# Patient Record
Sex: Female | Born: 1970 | Race: White | Hispanic: No | Marital: Married | Smoking: Former smoker
Health system: Southern US, Community
[De-identification: ages and names within clinical notes are randomized; demographics above are authoritative.]

## PROBLEM LIST (undated history)

## (undated) DIAGNOSIS — K219 Gastro-esophageal reflux disease without esophagitis: Secondary | ICD-10-CM

## (undated) DIAGNOSIS — E063 Autoimmune thyroiditis: Secondary | ICD-10-CM

## (undated) DIAGNOSIS — G43909 Migraine, unspecified, not intractable, without status migrainosus: Secondary | ICD-10-CM

## (undated) HISTORY — PX: OTHER SURGICAL HISTORY: SHX169

## (undated) HISTORY — DX: Autoimmune thyroiditis: E06.3

## (undated) HISTORY — DX: Gastro-esophageal reflux disease without esophagitis: K21.9

---

## 2006-09-22 ENCOUNTER — Encounter: Admission: RE | Admit: 2006-09-22 | Discharge: 2006-09-22 | Payer: Self-pay | Admitting: Endocrinology

## 2007-04-07 HISTORY — PX: OTHER SURGICAL HISTORY: SHX169

## 2007-10-18 ENCOUNTER — Encounter: Admission: RE | Admit: 2007-10-18 | Discharge: 2007-10-18 | Payer: Self-pay | Admitting: Endocrinology

## 2007-11-03 ENCOUNTER — Encounter: Admission: RE | Admit: 2007-11-03 | Discharge: 2007-11-03 | Payer: Self-pay | Admitting: Endocrinology

## 2007-12-23 ENCOUNTER — Encounter (INDEPENDENT_AMBULATORY_CARE_PROVIDER_SITE_OTHER): Payer: Self-pay | Admitting: General Surgery

## 2007-12-23 ENCOUNTER — Ambulatory Visit (HOSPITAL_BASED_OUTPATIENT_CLINIC_OR_DEPARTMENT_OTHER): Admission: RE | Admit: 2007-12-23 | Discharge: 2007-12-23 | Payer: Self-pay | Admitting: General Surgery

## 2008-01-06 ENCOUNTER — Encounter: Admission: RE | Admit: 2008-01-06 | Discharge: 2008-01-06 | Payer: Self-pay | Admitting: General Surgery

## 2008-02-02 ENCOUNTER — Ambulatory Visit: Payer: Self-pay | Admitting: Oncology

## 2008-02-09 ENCOUNTER — Ambulatory Visit (HOSPITAL_COMMUNITY): Admission: RE | Admit: 2008-02-09 | Discharge: 2008-02-09 | Payer: Self-pay | Admitting: General Surgery

## 2008-02-09 ENCOUNTER — Encounter (INDEPENDENT_AMBULATORY_CARE_PROVIDER_SITE_OTHER): Payer: Self-pay | Admitting: Interventional Radiology

## 2010-04-27 ENCOUNTER — Encounter: Payer: Self-pay | Admitting: Endocrinology

## 2010-08-19 NOTE — Op Note (Signed)
NAMEJALEAH, LEFEVRE               ACCOUNT NO.:  [id number]   MEDICAL RECORD NO.:  [id number]          PATIENT TYPE:  AMB   LOCATION:  DSC                          FACILITY:  [facility]   PHYSICIAN:  Juanetta Gosling, MDDATE OF BIRTH:  29-Apr-1970   DATE OF PROCEDURE:  12/23/2007  DATE OF DISCHARGE:                               OPERATIVE REPORT   PREOPERATIVE DIAGNOSIS:  Left thigh melanoma.   POSTOPERATIVE DIAGNOSIS:  Left thigh melanoma.   PROCEDURE:  Left thigh melanoma wide local excision and left inguinal  sentinel node biopsy.   SURGEON:  Troy Sine. Dwain Sarna, MD   ASSISTANT:  Gabrielle Dare. Janee Morn, MD   ANESTHESIA:  General.   FINDINGS:  Sentinel node in the left groin with a final count of 1045  with a background of 0.   SPECIMENS:  Sentinel node and wide local excision melanoma to Pathology.   ESTIMATED BLOOD LOSS:  Minimal.   COMPLICATIONS:  None.   DRAINS:  None.   DISPOSITION:  To PACU in a stable condition.   HISTORY:  Ms. Kjos is a 40 year old female with a history of multiple  abnormal nevi, gets routine dermatologic followup with a history of  multiple mole removals.  She recently presented to Dr. Donzetta Starch with  complaints of couple of new moles, one on her left leg that was raised.  She noticed it while she was shaving.  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  After discussion with Mrs. Gaultney, we  planned to perform a left thigh melanoma wide local excision with 1-cm  margin as well as a left inguinal sentinel node biopsy.   PROCEDURE:  After informed consent was obtained, the patient was first  taken to Nuclear Medicine where she was injected with radioactive  tracer.  She was then brought to the operating room.  A 1 g of  intravenous Ancef was administered.  She then underwent general  endotracheal anesthesia without complication prior to prepping.  I then  used the NeoProbe to identify the site in her groin that was hot.  This  was very easy to identify, was several centimeters below Poupart's  ligament.  The left groin and thigh were then prepped and draped in a  standard sterile surgical fashion.  The area again was marked after  identification of the NeoProbe.  Approximately, a 2-cm incision was made  overlying the hot spot identified by the NeoProbe.  Dissection was  carried out down to the superficial nodes where there was several bundle  of lymph nodes noted, all of which were hot.  Prior to beginning this, a  total of 1 mL of blue dye had been infiltrated intradermally into the  skin around the melanoma.  The nodes in concern were also noted to be  blue as well.  These nodes were then removed and total count was taken  by the NeoProbe when this was done was approximately 1045.  The  background of the NeoProbe when placed into the wound was 0.  This wound  was irrigated.  Hemostasis was observed.  This was then closed with a  deep layer with 3-0 Vicryl and Monocryl in a subcuticular fashion for  the skin.  Attention was then turned towards her melanoma.  The melanoma  was marked, and a 1-cm margin in each direction was identified.  This  was approximately 3.5-cm wide from the area she had her biopsy.  I then  made an incision approximately 8 cm long to ensure that this would be  able to be closed correctly.  This included 1-cm margin in each area  around the melanoma.  Dissection was carried out down through the skin  and subcutaneous tissue, and this area was then removed in total.  This  was marked with a single stitch in the superior portion.  Upon  completion of this, hemostasis was observed.  A deep layer of 2-0 Vicryl  was then used to close the superficial tissue and then 2-0 nylon sutures  were used in a vertical mattress fashion to close the skin.  This  closed  well.  She tolerated this procedure well.  Local anesthetic was  infiltrated in both wounds.  Dressing was placed on the inferior most  wound, and Dermabond was placed on the groin wound.  She was awakened  and extubated in the operating room and transferred to the recovery in a  stable condition.      Juanetta Gosling, MD  Electronically Signed     MCW/MEDQ  D:  12/23/2007  T:  12/24/2007  Job:  [id number]   cc:   Rocco Serene, M.D.  Selinda Flavin

## 2010-08-27 ENCOUNTER — Ambulatory Visit (HOSPITAL_COMMUNITY)
Admission: RE | Admit: 2010-08-27 | Discharge: 2010-08-27 | Disposition: A | Discharge status: Home / Self Care | Payer: Commercial Managed Care - PPO | Source: Ambulatory Visit | Attending: Family Medicine | Admitting: Family Medicine

## 2010-08-27 DIAGNOSIS — I890 Lymphedema, not elsewhere classified: Secondary | ICD-10-CM | POA: Insufficient documentation

## 2010-08-27 DIAGNOSIS — M6281 Muscle weakness (generalized): Secondary | ICD-10-CM | POA: Insufficient documentation

## 2010-08-27 DIAGNOSIS — R262 Difficulty in walking, not elsewhere classified: Secondary | ICD-10-CM | POA: Insufficient documentation

## 2010-08-27 DIAGNOSIS — IMO0001 Reserved for inherently not codable concepts without codable children: Secondary | ICD-10-CM | POA: Insufficient documentation

## 2011-01-05 LAB — COMPREHENSIVE METABOLIC PANEL
ALT: 15
Alkaline Phosphatase: 46
BUN: 10
CO2: 27
GFR calc non Af Amer: >60
Total Bilirubin: 0.7
Total Protein: 6.6

## 2011-01-05 LAB — PREGNANCY, URINE: Preg Test, Ur: NEGATIVE

## 2011-01-05 LAB — DIFFERENTIAL
Basophils Absolute: 0
Lymphocytes Relative: 31
Lymphs Abs: 1.7
Monocytes Relative: 7

## 2011-01-05 LAB — CBC: RDW: 13.2

## 2011-01-06 LAB — BODY FLUID CULTURE: Culture: NO GROWTH

## 2011-09-30 ENCOUNTER — Other Ambulatory Visit: Payer: Self-pay

## 2012-04-01 ENCOUNTER — Other Ambulatory Visit: Payer: Self-pay

## 2012-08-25 ENCOUNTER — Other Ambulatory Visit: Payer: Self-pay

## 2012-10-31 ENCOUNTER — Other Ambulatory Visit: Payer: Self-pay

## 2013-05-26 ENCOUNTER — Other Ambulatory Visit: Payer: Self-pay

## 2013-07-06 ENCOUNTER — Other Ambulatory Visit: Payer: Self-pay

## 2013-07-06 DIAGNOSIS — Z1231 Encounter for screening mammogram for malignant neoplasm of breast: Secondary | ICD-10-CM

## 2013-07-31 ENCOUNTER — Ambulatory Visit
Admission: RE | Admit: 2013-07-31 | Discharge: 2013-07-31 | Disposition: A | Discharge status: Home / Self Care | Payer: 59 | Source: Ambulatory Visit

## 2013-07-31 ENCOUNTER — Encounter (INDEPENDENT_AMBULATORY_CARE_PROVIDER_SITE_OTHER): Payer: Self-pay

## 2013-07-31 DIAGNOSIS — Z1231 Encounter for screening mammogram for malignant neoplasm of breast: Secondary | ICD-10-CM

## 2016-01-27 ENCOUNTER — Other Ambulatory Visit: Payer: Self-pay | Admitting: Unknown Physician Specialty

## 2016-01-27 DIAGNOSIS — Z1231 Encounter for screening mammogram for malignant neoplasm of breast: Secondary | ICD-10-CM

## 2016-02-19 ENCOUNTER — Ambulatory Visit: Payer: Self-pay

## 2016-03-18 ENCOUNTER — Ambulatory Visit: Payer: Self-pay

## 2016-04-17 DIAGNOSIS — M542 Cervicalgia: Secondary | ICD-10-CM | POA: Diagnosis not present

## 2016-04-27 DIAGNOSIS — M5412 Radiculopathy, cervical region: Secondary | ICD-10-CM | POA: Diagnosis not present

## 2016-07-01 ENCOUNTER — Ambulatory Visit
Admission: RE | Admit: 2016-07-01 | Discharge: 2016-07-01 | Disposition: A | Discharge status: Home / Self Care | Payer: 59 | Source: Ambulatory Visit | Attending: Unknown Physician Specialty | Admitting: Unknown Physician Specialty

## 2016-07-01 DIAGNOSIS — Z1231 Encounter for screening mammogram for malignant neoplasm of breast: Secondary | ICD-10-CM

## 2016-08-03 DIAGNOSIS — N390 Urinary tract infection, site not specified: Secondary | ICD-10-CM | POA: Diagnosis not present

## 2016-10-08 DIAGNOSIS — E039 Hypothyroidism, unspecified: Secondary | ICD-10-CM | POA: Diagnosis not present

## 2016-10-09 DIAGNOSIS — E039 Hypothyroidism, unspecified: Secondary | ICD-10-CM | POA: Diagnosis not present

## 2016-10-09 DIAGNOSIS — E042 Nontoxic multinodular goiter: Secondary | ICD-10-CM | POA: Diagnosis not present

## 2016-10-13 DIAGNOSIS — Z08 Encounter for follow-up examination after completed treatment for malignant neoplasm: Secondary | ICD-10-CM | POA: Diagnosis not present

## 2016-10-13 DIAGNOSIS — Z85820 Personal history of malignant melanoma of skin: Secondary | ICD-10-CM | POA: Diagnosis not present

## 2016-10-13 DIAGNOSIS — Z1283 Encounter for screening for malignant neoplasm of skin: Secondary | ICD-10-CM | POA: Diagnosis not present

## 2016-10-19 DIAGNOSIS — R5383 Other fatigue: Secondary | ICD-10-CM | POA: Diagnosis not present

## 2016-10-19 DIAGNOSIS — G4489 Other headache syndrome: Secondary | ICD-10-CM | POA: Diagnosis not present

## 2016-10-19 DIAGNOSIS — Z0000 Encounter for general adult medical examination without abnormal findings: Secondary | ICD-10-CM | POA: Diagnosis not present

## 2016-10-19 DIAGNOSIS — E039 Hypothyroidism, unspecified: Secondary | ICD-10-CM | POA: Diagnosis not present

## 2017-01-29 DIAGNOSIS — Z23 Encounter for immunization: Secondary | ICD-10-CM | POA: Diagnosis not present

## 2017-05-07 DIAGNOSIS — Z719 Counseling, unspecified: Secondary | ICD-10-CM | POA: Diagnosis not present

## 2017-11-08 ENCOUNTER — Other Ambulatory Visit: Payer: Self-pay | Admitting: Unknown Physician Specialty

## 2017-11-08 DIAGNOSIS — Z1231 Encounter for screening mammogram for malignant neoplasm of breast: Secondary | ICD-10-CM

## 2017-11-12 DIAGNOSIS — L0390 Cellulitis, unspecified: Secondary | ICD-10-CM | POA: Diagnosis not present

## 2017-12-01 DIAGNOSIS — E039 Hypothyroidism, unspecified: Secondary | ICD-10-CM | POA: Diagnosis not present

## 2017-12-01 DIAGNOSIS — Z6829 Body mass index (BMI) 29.0-29.9, adult: Secondary | ICD-10-CM | POA: Diagnosis not present

## 2017-12-01 DIAGNOSIS — Z0000 Encounter for general adult medical examination without abnormal findings: Secondary | ICD-10-CM | POA: Diagnosis not present

## 2017-12-10 ENCOUNTER — Ambulatory Visit: Payer: 59

## 2017-12-10 ENCOUNTER — Ambulatory Visit
Admission: RE | Admit: 2017-12-10 | Discharge: 2017-12-10 | Disposition: A | Discharge status: Home / Self Care | Payer: 59 | Source: Ambulatory Visit | Attending: Unknown Physician Specialty | Admitting: Unknown Physician Specialty

## 2017-12-10 DIAGNOSIS — Z1231 Encounter for screening mammogram for malignant neoplasm of breast: Secondary | ICD-10-CM

## 2017-12-13 DIAGNOSIS — E039 Hypothyroidism, unspecified: Secondary | ICD-10-CM | POA: Diagnosis not present

## 2017-12-15 DIAGNOSIS — E039 Hypothyroidism, unspecified: Secondary | ICD-10-CM | POA: Diagnosis not present

## 2017-12-15 DIAGNOSIS — E042 Nontoxic multinodular goiter: Secondary | ICD-10-CM | POA: Diagnosis not present

## 2018-01-31 DIAGNOSIS — Z23 Encounter for immunization: Secondary | ICD-10-CM | POA: Diagnosis not present

## 2018-03-01 DIAGNOSIS — E039 Hypothyroidism, unspecified: Secondary | ICD-10-CM | POA: Diagnosis not present

## 2018-04-04 DIAGNOSIS — D225 Melanocytic nevi of trunk: Secondary | ICD-10-CM | POA: Diagnosis not present

## 2018-04-04 DIAGNOSIS — Z1283 Encounter for screening for malignant neoplasm of skin: Secondary | ICD-10-CM | POA: Diagnosis not present

## 2018-04-04 DIAGNOSIS — Z85820 Personal history of malignant melanoma of skin: Secondary | ICD-10-CM | POA: Diagnosis not present

## 2018-05-04 DIAGNOSIS — K210 Gastro-esophageal reflux disease with esophagitis: Secondary | ICD-10-CM | POA: Diagnosis not present

## 2018-05-04 DIAGNOSIS — R05 Cough: Secondary | ICD-10-CM | POA: Diagnosis not present

## 2018-05-04 DIAGNOSIS — Z6830 Body mass index (BMI) 30.0-30.9, adult: Secondary | ICD-10-CM | POA: Diagnosis not present

## 2018-06-06 DIAGNOSIS — Z6830 Body mass index (BMI) 30.0-30.9, adult: Secondary | ICD-10-CM | POA: Diagnosis not present

## 2018-06-06 DIAGNOSIS — J0101 Acute recurrent maxillary sinusitis: Secondary | ICD-10-CM | POA: Diagnosis not present

## 2018-06-28 DIAGNOSIS — J019 Acute sinusitis, unspecified: Secondary | ICD-10-CM | POA: Diagnosis not present

## 2018-12-02 ENCOUNTER — Other Ambulatory Visit: Payer: Self-pay | Admitting: Unknown Physician Specialty

## 2018-12-02 DIAGNOSIS — Z1231 Encounter for screening mammogram for malignant neoplasm of breast: Secondary | ICD-10-CM

## 2019-01-17 ENCOUNTER — Ambulatory Visit: Payer: 59

## 2019-09-27 ENCOUNTER — Other Ambulatory Visit: Payer: Self-pay

## 2019-09-27 ENCOUNTER — Ambulatory Visit
Admission: RE | Admit: 2019-09-27 | Discharge: 2019-09-27 | Disposition: A | Discharge status: Home / Self Care | Payer: 59 | Source: Ambulatory Visit | Attending: Unknown Physician Specialty | Admitting: Unknown Physician Specialty

## 2019-09-27 DIAGNOSIS — Z1231 Encounter for screening mammogram for malignant neoplasm of breast: Secondary | ICD-10-CM

## 2019-12-06 HISTORY — PX: COLONOSCOPY: SHX174

## 2020-04-10 ENCOUNTER — Encounter: Payer: Self-pay | Admitting: Internal Medicine

## 2020-05-01 ENCOUNTER — Encounter: Payer: Self-pay | Admitting: *Deleted

## 2020-05-01 ENCOUNTER — Encounter: Payer: Self-pay | Admitting: Internal Medicine

## 2020-05-01 ENCOUNTER — Ambulatory Visit (INDEPENDENT_AMBULATORY_CARE_PROVIDER_SITE_OTHER): Payer: 59 | Admitting: Gastroenterology

## 2020-05-01 ENCOUNTER — Other Ambulatory Visit: Payer: Self-pay

## 2020-05-01 ENCOUNTER — Encounter: Payer: Self-pay | Admitting: Gastroenterology

## 2020-05-01 DIAGNOSIS — K219 Gastro-esophageal reflux disease without esophagitis: Secondary | ICD-10-CM | POA: Diagnosis not present

## 2020-05-01 DIAGNOSIS — K642 Third degree hemorrhoids: Secondary | ICD-10-CM

## 2020-05-01 NOTE — Patient Instructions (Addendum)
Continue omeprazole once daily, making sure 30 minutes before breakfast for best efficacy.  I would like for you to try Linzess. Start taking Linzess 1 capsule 30 minutes before breakfast daily. It is normal to have some looser stool starting out for the first few days, but this should improve. If it does not, please call us, as we will need to adjust the dosage.   We will arrange a hemorrhoid banding in the near future!  I have also ordered an xray of your esophagus to see what is going on with your swallowing.  I have attached a reflux handout sheet that may be helpful!  It was a pleasure to see you today. I want to create trusting relationships with patients to provide genuine, compassionate, and quality care. I value your feedback. If you receive a survey regarding your visit,  I greatly appreciate you taking time to fill this out.   Annitta Needs, PhD, ANP-BC [facility]    Food Choices for Gastroesophageal Reflux Disease, Adult When you have gastroesophageal reflux disease (GERD), the foods you eat and your eating habits are very important. Choosing the right foods can help ease the discomfort of GERD. Consider working with a dietitian to help you make healthy food choices. What are tips for following this plan? Reading food labels  Look for foods that are low in saturated fat. Foods that have less than 5% of daily value (DV) of fat and 0 g of trans fats may help with your symptoms. Cooking  Cook foods using methods other than frying. This may include baking, steaming, grilling, or broiling. These are all methods that do not need a lot of fat for cooking.  To add flavor, try to use herbs that are low in spice and acidity. Meal planning  Choose healthy foods that are low in fat, such as fruits, vegetables, whole grains, low-fat dairy products, lean meats, fish, and poultry.  Eat frequent, small meals instead of three large meals each day. Eat your meals slowly, in  a relaxed setting. Avoid bending over or lying down until 2-3 hours after eating.  Limit high-fat foods such as fatty meats or fried foods.  Limit your intake of fatty foods, such as oils, butter, and shortening.  Avoid the following as told by your health care provider: ? Foods that cause symptoms. These may be different for different people. Keep a food diary to keep track of foods that cause symptoms. ? Alcohol. ? Drinking large amounts of liquid with meals. ? Eating meals during the 2-3 hours before bed.   Lifestyle  Maintain a healthy weight. Ask your health care provider what weight is healthy for you. If you need to lose weight, work with your health care provider to do so safely.  Exercise for at least 30 minutes on 5 or more days each week, or as told by your health care provider.  Avoid wearing clothes that fit tightly around your waist and chest.  Do not use any products that contain nicotine or tobacco. These products include cigarettes, chewing tobacco, and vaping devices, such as e-cigarettes. If you need help quitting, ask your health care provider.  Sleep with the head of your bed raised. Use a wedge under the mattress or blocks under the bed frame to raise the head of the bed.  Chew sugar-free gum after mealtimes. What foods should I eat? Eat a healthy, well-balanced diet of fruits, vegetables, whole grains, low-fat dairy products, lean meats, fish, and poultry. Each  person is different. Foods that may trigger symptoms in one person may not trigger any symptoms in another person. Work with your health care provider to identify foods that are safe for you. The items listed above may not be a complete list of recommended foods and beverages. Contact a dietitian for more information.   What foods should I avoid? Limiting some of these foods may help manage the symptoms of GERD. Everyone is different. Consult a dietitian or your health care provider to help you identify the  exact foods to avoid, if any. Fruits Any fruits prepared with added fat. Any fruits that cause symptoms. For some people this may include citrus fruits, such as oranges, grapefruit, pineapple, and lemons. Vegetables Deep-fried vegetables. [place] fries. Any vegetables prepared with added fat. Any vegetables that cause symptoms. For some people, this may include tomatoes and tomato products, chili peppers, onions and garlic, and horseradish. Grains Pastries or quick breads with added fat. Meats and other proteins High-fat meats, such as fatty beef or pork, hot dogs, ribs, ham, sausage, salami, and bacon. Fried meat or protein, including fried fish and fried chicken. Nuts and nut butters, in large amounts. Dairy Whole milk and chocolate milk. Sour cream. Cream. Ice cream. Cream cheese. Milkshakes. Fats and oils Butter. Margarine. Shortening. Ghee. Beverages Coffee and tea, with or without caffeine. Carbonated beverages. Sodas. Energy drinks. Fruit juice made with acidic fruits, such as orange or grapefruit. Tomato juice. Alcoholic drinks. Sweets and desserts Chocolate and cocoa. Donuts. Seasonings and condiments Pepper. Peppermint and spearmint. Added salt. Any condiments, herbs, or seasonings that cause symptoms. For some people, this may include curry, hot sauce, or vinegar-based salad dressings. The items listed above may not be a complete list of foods and beverages to avoid. Contact a dietitian for more information. Questions to ask your health care provider Diet and lifestyle changes are usually the first steps that are taken to manage symptoms of GERD. If diet and lifestyle changes do not improve your symptoms, talk with your health care provider about taking medicines. Where to find more information  International Foundation for Gastrointestinal Disorders: aboutgerd.org Summary  When you have gastroesophageal reflux disease (GERD), food and lifestyle choices may be very helpful in  easing the discomfort of GERD.  Eat frequent, small meals instead of three large meals each day. Eat your meals slowly, in a relaxed setting. Avoid bending over or lying down until 2-3 hours after eating.  Limit high-fat foods such as fatty meats or fried foods. This information is not intended to replace advice given to you by your health care provider. Make sure you discuss any questions you have with your health care provider. Document Revised: 10/02/2019 Document Reviewed: 10/02/2019 Elsevier Patient Education  [place].

## 2020-05-01 NOTE — Progress Notes (Signed)
Primary Care Physician:  Rory Percy, MD  Referring Physician: Launa Grill, PA-C/Dr. Nadara Mustard Primary Gastroenterologist:  Dr. Abbey Chatters  Chief Complaint  Patient presents with  . Hemorrhoids    Saw surgeon at [place]. Had TCS done by them as well 12/2019. Had thrombosed hem removed in May    HPI:   Jackie Ward is a 50 y.o. female presenting today at the request of Launa Grill, PA-C/Dr. Nadara Mustard due to constipation and hemorrhoids. Colonoscopy completed at [facility] in Sept 2021 with minimal internal hemorrhoids, no polyps. Moderate external hemorrhoid skin tags.   No straining. 3 colace a day. Stool soft. Sometimes not as productive. BM twice a week since she was young.  On toilet 10-15 minutes at a time. Will have BM then wait, then will go again while sitting there. No abdominal pain. Fiber made stool harder. No rectal pain or discomfort. More flares of hemorrhoids later. Feels almost constant to have tissue out. Fecal soiling.   Episodes of coughing in past, which had improved with omeprazole. Now returned. Used to have nocturnal coughing. Now will be during the day. Coughing once or twice per day. Feels like food goes down the wrong way at times. No globus sensation.   Past Medical History:  Diagnosis Date  . GERD (gastroesophageal reflux disease)   . Hashimoto's disease     Past Surgical History:  Procedure Laterality Date  . COLONOSCOPY  12/2019   [facility]: minimal internal hemorrhoids, no polyps. Moderate external hemorrhoid skin tags.   . melanoma back excision    . melanoma thigh excision  2009    Current Outpatient Medications  Medication Sig Dispense Refill  . Cetirizine HCl (ZYRTEC ALLERGY) 10 MG CAPS Take by mouth as needed.    Mariane Baumgarten Sodium (DSS) 100 MG CAPS Take 3 capsules by mouth in the morning.    . DULoxetine (CYMBALTA) 60 MG capsule Take 1 capsule by mouth 2 (two) times daily.    . Ferrous Fumarate 150 MG TABS Take 1 tablet by mouth every  other day.    . Levothyroxine Sodium (TIROSINT-SOL) 100 MCG/ML SOLN 100MCG 1 ML daily    . omeprazole (PRILOSEC) 20 MG capsule Take 20 mg by mouth daily.     No current facility-administered medications for this visit.    Allergies as of 05/01/2020  . (No Known Allergies)    Family History  Problem Relation Age of Onset  . Breast cancer Mother 62  . Colon cancer Neg Hx   . Colon polyps Neg Hx     Social History   Socioeconomic History  . Marital status: Married    Spouse name: Not on file  . Number of children: Not on file  . Years of education: Not on file  . Highest education level: Not on file  Occupational History  . Not on file  Tobacco Use  . Smoking status: Former Smoker    Types: Cigarettes  . Smokeless tobacco: Never Used  . Tobacco comment: smoked as a teenager  Substance and Sexual Activity  . Alcohol use: Yes    Comment: once a month  . Drug use: Never  . Sexual activity: Not on file  Other Topics Concern  . Not on file  Social History Narrative  . Not on file   Social Determinants of Health   Financial Resource Strain: Not on file  Food Insecurity: Not on file  Transportation Needs: Not on file  Physical Activity: Not on file  Stress: Not on file  Social Connections: Not on file  Intimate Partner Violence: Not on file    Review of Systems: Gen: Denies any fever, chills, fatigue, weight loss, lack of appetite.  CV: Denies chest pain, heart palpitations, peripheral edema, syncope.  Resp: Denies shortness of breath at rest or with exertion. Denies wheezing or cough.  GI: see HPI GU : Denies urinary burning, urinary frequency, urinary hesitancy MS: Denies joint pain, muscle weakness, cramps, or limitation of movement.  Derm: Denies rash, itching, dry skin Psych: Denies depression, anxiety, memory loss, and confusion Heme: see HPI  Physical Exam: BP 133/81   Pulse (!) 114   Temp (!) 97 F (36.1 C)   Ht 5\' 5"  (1.651 m)   Wt 201 lb 8 oz  (91.4 kg)   LMP 04/22/2020   BMI 33.53 kg/m  General:   Alert and oriented. Pleasant and cooperative. Well-nourished and well-developed.  Head:  Normocephalic and atraumatic. Eyes:  Without icterus, sclera clear and conjunctiva pink.  Ears:  Normal auditory acuity. Mouth: mask in place Lungs:  Clear to auscultation bilaterally. No wheezes, rales, or rhonchi. No distress.  Heart:  S1, S2 present without murmurs appreciated.  Abdomen:  +BS, soft, non-tender and non-distended. No HSM noted. No guarding or rebound. No masses appreciated.  Rectal:  External hemorrhoid tags, no thrombosis. Grade 3 mild prolapsing hemorrhoids easily reduced. No mass DRE. No discomfort.  Msk:  Symmetrical without gross deformities. Normal posture. Extremities:  Without edema. Neurologic:  Alert and  oriented x4;  grossly normal neurologically. Skin:  Intact without significant lesions or rashes. Psych:  Alert and cooperative. Normal mood and affect.  ASSESSMENT: Jackie Ward is a 50 y.o. female presenting today with symptomatic hemorrhoids in the setting of constipation and GERD as a new consult.  Recent colonoscopy on file from outside facility. Clinically, she is dealing with Grade 3 hemorrhoids and would be a good banding candidate. We will try to work towards a better bowel regimen as well, avoidance of straining, and limiting toilet time. Linzess 145 mcg samples provided.   GERD: on omeprazole 20 mg daily. Intermittent coughing. Likely LPR component. Also reporting foods "going down the wrong way". Will pursue UGI. May need more aggressive PPI therapy and EGD in future.    PLAN: Linzess 145 mcg daily Continue omeprazole daily UGI in near future Return for hemorrhoid banding   Annitta Needs, PhD, [facility]

## 2020-05-06 ENCOUNTER — Ambulatory Visit (HOSPITAL_COMMUNITY)
Admission: RE | Admit: 2020-05-06 | Discharge: 2020-05-06 | Disposition: A | Discharge status: Home / Self Care | Payer: 59 | Source: Ambulatory Visit | Attending: Gastroenterology | Admitting: Gastroenterology

## 2020-05-06 ENCOUNTER — Other Ambulatory Visit: Payer: Self-pay

## 2020-05-06 DIAGNOSIS — K219 Gastro-esophageal reflux disease without esophagitis: Secondary | ICD-10-CM | POA: Diagnosis present

## 2020-06-13 ENCOUNTER — Ambulatory Visit (INDEPENDENT_AMBULATORY_CARE_PROVIDER_SITE_OTHER): Payer: 59 | Admitting: Gastroenterology

## 2020-06-13 ENCOUNTER — Encounter: Payer: Self-pay | Admitting: Gastroenterology

## 2020-06-13 ENCOUNTER — Other Ambulatory Visit: Payer: Self-pay

## 2020-06-13 VITALS — BP 118/78 | HR 95 | Temp 97.3°F | Ht 65.0 in | Wt 202.0 lb

## 2020-06-13 DIAGNOSIS — K642 Third degree hemorrhoids: Secondary | ICD-10-CM

## 2020-06-13 MED ORDER — LUBIPROSTONE 24 MCG PO CAPS: 24.0000 ug | ORAL_CAPSULE | Freq: Two times a day (BID) | ORAL | 3 refills | Status: DC

## 2020-06-13 NOTE — Progress Notes (Signed)
[facility]erve Banding Note:    Pleasant 50 year old female with history of constipation, symptomatic Grade 3 hemorrhoids, and recent colonoscopy from Sept 2021 on file at [facility] with internal hemorrhoids and no polyps. Presenting today for banding. No prior banding. Linzess 145 mcg too strong and Linzess 72 mcg not strong enough. BM every 4-5 days. Associated abdominal discomfort and bloating.   The patient presents with symptomatic grade 3 hemorrhoids, unresponsive to maximal medical therapy, requesting rubber band ligation of her hemorrhoidal disease. All risks, benefits, and alternative forms of therapy were described and informed consent was obtained.   The decision was made to band the left lateral internal hemorrhoid, and the [place] was used to perform band ligation without complication. Digital anorectal examination was then performed to assure proper positioning of the band, and to adjust the banded tissue as required. The patient was discharged home without pain or other issues. Dietary and behavioral recommendations were given, including prescription for Amitiza 24 mcg po BID, along with follow-up instructions. The patient will return in 2-3 weeks for followup and possible additional banding as required.  No complications were encountered and the patient tolerated the procedure well.  Annitta Needs, PhD, ANP-BC [facility]

## 2020-06-13 NOTE — Patient Instructions (Signed)
I have sent Amitiza 24 mcg into the pharmacy. This is indicated for twice a day WITH FOOD (to avoid nausea). The good thing about this medication is you can decrease to once daily if needed; however, it is indicated for twice a day. Let's start off with twice a day now and can titrate down if needed.  We will see you in a few weeks for repeat banding!  I enjoyed seeing you again today! As you know, I value our relationship and want to provide genuine, compassionate, and quality care. I welcome your feedback. If you receive a survey regarding your visit,  I greatly appreciate you taking time to fill this out. See you next time!  Annitta Needs, PhD, ANP-BC [facility]

## 2020-07-02 NOTE — Telephone Encounter (Signed)
Jackie Ward,  Please see below from patient. Did we get a PA for Amitiza to complete?

## 2020-07-24 ENCOUNTER — Encounter: Payer: Self-pay | Admitting: Internal Medicine

## 2020-07-24 ENCOUNTER — Encounter: Payer: 59 | Admitting: Gastroenterology

## 2020-08-01 ENCOUNTER — Telehealth: Payer: Self-pay

## 2020-08-01 NOTE — Telephone Encounter (Signed)
Pt approved for Amitiza 24 mcg capsules. Manuella Ghazi of this which she will contact pt via Mychart. Pt's Pharmacy made aware and given to Manuela Schwartz to scan into the pt's chart.

## 2020-08-18 ENCOUNTER — Ambulatory Visit
Admission: RE | Admit: 2020-08-18 | Discharge: 2020-08-18 | Disposition: A | Discharge status: Home / Self Care | Payer: 59 | Source: Ambulatory Visit

## 2020-08-18 ENCOUNTER — Other Ambulatory Visit: Payer: Self-pay

## 2020-08-18 VITALS — BP 120/85 | HR 114 | Temp 98.4°F | Resp 16

## 2020-08-18 DIAGNOSIS — B349 Viral infection, unspecified: Secondary | ICD-10-CM

## 2020-08-18 HISTORY — DX: Migraine, unspecified, not intractable, without status migrainosus: G43.909

## 2020-08-18 MED ORDER — BENZONATATE 100 MG PO CAPS: 100.0000 mg | ORAL_CAPSULE | Freq: Three times a day (TID) | ORAL | 0 refills | Status: DC

## 2020-08-18 NOTE — Discharge Instructions (Signed)
May use ibuprofen and tylenol and as needed for aches and headaches  I have sent in tessalon perles for you to use one capsule every 8 hours as needed for cough.  Follow up with this office or with primary care if symptoms are persisting.  Follow up in the ER for high fever, trouble swallowing, trouble breathing, other concerning symptoms.

## 2020-08-18 NOTE — ED Triage Notes (Signed)
Coughing started on Thursday.  Did home covid test on Friday that was negative.  States she feels exhausted.  States she just finished prednisone.  Sore throat since Friday.

## 2020-08-18 NOTE — ED Provider Notes (Signed)
[facility]    CSN: [id number] Arrival date & time: 08/18/20  0855      History   Chief Complaint No chief complaint on file.   HPI Jackie Ward is a 50 y.o. female.   Reports that she has been having cough, fatigue, sore throat for the last 4 days.  Reports that she has history of Hashimoto's and she just finished a round of steroids for migraines.  Has had 2 negative home COVID test.  Has negative history of COVID.  Has completed COVID vaccines.  Has completed flu vaccine.  Denies sick contacts.  Denies headache, shortness of breath, abdominal pain, nausea, vomiting, diarrhea, rash, fever, other symptoms.  ROS per HPI  The history is provided by the patient.    Past Medical History:  Diagnosis Date  . GERD (gastroesophageal reflux disease)   . Hashimoto's disease   . Hashimoto's disease   . Hashimoto's disease   . Migraine     Patient Active Problem List   Diagnosis Date Noted  . Prolapsed internal hemorrhoids, grade 3 05/01/2020  . GERD (gastroesophageal reflux disease) 05/01/2020    Past Surgical History:  Procedure Laterality Date  . COLONOSCOPY  12/2019   [facility]: minimal internal hemorrhoids, no polyps. Moderate external hemorrhoid skin tags.   . melanoma back excision    . melanoma thigh excision  2009    OB History   No obstetric history on file.      Home Medications    Prior to Admission medications   Medication Sig Start Date End Date Taking? Authorizing Provider  benzonatate (TESSALON) 100 MG capsule Take 1 capsule (100 mg total) by mouth every 8 (eight) hours. 08/18/20  Yes Faustino Congress, NP  rizatriptan (MAXALT) 10 MG tablet Take 10 mg by mouth as needed for migraine. May repeat in 2 hours if needed   Yes [provider]  Cetirizine HCl (ZYRTEC ALLERGY) 10 MG CAPS Take by mouth as needed.    [provider]  Docusate Sodium (DSS) 100 MG CAPS Take 3 capsules by mouth in the morning. Patient not  taking: Reported on 06/13/2020    [provider]  DULoxetine (CYMBALTA) 60 MG capsule Take 1 capsule by mouth 2 (two) times daily. 03/01/20   [provider]  Ferrous Fumarate 150 MG TABS Take 1 tablet by mouth every other day.    [provider]  Levothyroxine Sodium (TIROSINT-SOL) 100 MCG/ML SOLN 100MCG 1 ML daily 05/03/19   [provider]  lubiprostone (AMITIZA) 24 MCG capsule Take 1 capsule (24 mcg total) by mouth 2 (two) times daily with a meal. 06/13/20   Annitta Needs, NP  omeprazole (PRILOSEC) 20 MG capsule Take 20 mg by mouth 2 (two) times daily.    [provider]    Family History Family History  Problem Relation Age of Onset  . Breast cancer Mother 42  . Colon cancer Neg Hx   . Colon polyps Neg Hx     Social History Social History   Tobacco Use  . Smoking status: Former Smoker    Types: Cigarettes  . Smokeless tobacco: Never Used  . Tobacco comment: smoked as a teenager  Substance Use Topics  . Alcohol use: Yes    Comment: once a month  . Drug use: Never     Allergies   Patient has no known allergies.   Review of Systems Review of Systems   Physical Exam Triage Vital Signs ED Triage Vitals  Enc Vitals Group     BP 08/18/20 0900 120/85     Pulse Rate 08/18/20 0900 (!) 114     Resp 08/18/20 0900 16     Temp 08/18/20 0900 98.4 F (36.9 C)     Temp Source 08/18/20 0900 Oral     SpO2 08/18/20 0900 97 %     Weight --      Height --      Head Circumference --      Peak Flow --      Pain Score 08/18/20 0903 0     Pain Loc --      Pain Edu? --      Excl. in [place]? --    No data found.  Updated Vital Signs BP 120/85 (BP Location: Right Arm)   Pulse (!) 114   Temp 98.4 F (36.9 C) (Oral)   Resp 16   LMP 07/23/2020   SpO2 97%       Physical Exam Vitals and nursing note reviewed.  Constitutional:      General: She is not in acute distress.    Appearance: Normal appearance. She is well-developed and  normal weight. She is not ill-appearing.  HENT:     Head: Normocephalic and atraumatic.     Right Ear: Tympanic membrane, ear canal and external ear normal.     Left Ear: Tympanic membrane, ear canal and external ear normal.     Nose: Nose normal.     Mouth/Throat:     Mouth: Mucous membranes are moist.     Pharynx: Posterior oropharyngeal erythema present.  Eyes:     Extraocular Movements: Extraocular movements intact.     Conjunctiva/sclera: Conjunctivae normal.     Pupils: Pupils are equal, round, and reactive to light.  Cardiovascular:     Rate and Rhythm: Normal rate and regular rhythm.     Heart sounds: Normal heart sounds. No murmur heard.   Pulmonary:     Effort: Pulmonary effort is normal. No respiratory distress.     Breath sounds: Normal breath sounds. No stridor. No wheezing, rhonchi or rales.     Comments: Dry cough present  Chest:     Chest wall: No tenderness.  Abdominal:     Palpations: Abdomen is soft.     Tenderness: There is no abdominal tenderness.  Musculoskeletal:        General: Normal range of motion.     Cervical back: Normal range of motion and neck supple.  Lymphadenopathy:     Cervical: No cervical adenopathy.  Skin:    General: Skin is warm and dry.     Capillary Refill: Capillary refill takes less than 2 seconds.  Neurological:     General: No focal deficit present.     Mental Status: She is alert and oriented to person, place, and time.  Psychiatric:        Mood and Affect: Mood normal.        Behavior: Behavior normal.        Thought Content: Thought content normal.      UC Treatments / Results  Labs (all labs ordered are listed, but only abnormal results are displayed) Labs Reviewed - No data to display  EKG   Radiology No results found.  Procedures Procedures (including critical care time)  Medications Ordered in UC Medications - No data to display  Initial Impression / Assessment and Plan / UC Course  I have reviewed  the triage vital signs and the  nursing notes.  Pertinent labs & imaging results that were available during my care of the patient were reviewed by me and considered in my medical decision making (see chart for details).    Viral Illness  Discussed that she likely has viral illness Low concern for COVID given last home rapid negative test was yesterday Declines COVID and flu testing today Prescribed Tessalon Perles to use.  Cough May continue Zyrtec as needed Follow up with this office or with primary care if symptoms are persisting. Follow up in the ER for high fever, trouble swallowing, trouble breathing, other concerning symptoms.   Final Clinical Impressions(s) / UC Diagnoses   Final diagnoses:  Viral illness     Discharge Instructions     May use ibuprofen and tylenol and as needed for aches and headaches  I have sent in tessalon perles for you to use one capsule every 8 hours as needed for cough.  Follow up with this office or with primary care if symptoms are persisting.  Follow up in the ER for high fever, trouble swallowing, trouble breathing, other concerning symptoms.     ED Prescriptions    Medication Sig Dispense Auth. Provider   benzonatate (TESSALON) 100 MG capsule Take 1 capsule (100 mg total) by mouth every 8 (eight) hours. 21 capsule Faustino Congress, NP     PDMP not reviewed this encounter.   Faustino Congress, NP 08/18/20 [phone]

## 2020-09-11 ENCOUNTER — Ambulatory Visit (INDEPENDENT_AMBULATORY_CARE_PROVIDER_SITE_OTHER): Payer: 59

## 2020-09-11 ENCOUNTER — Ambulatory Visit
Admission: EM | Admit: 2020-09-11 | Discharge: 2020-09-11 | Disposition: A | Discharge status: Home / Self Care | Payer: 59 | Attending: Family Medicine | Admitting: Family Medicine

## 2020-09-11 DIAGNOSIS — R0602 Shortness of breath: Secondary | ICD-10-CM

## 2020-09-11 DIAGNOSIS — R059 Cough, unspecified: Secondary | ICD-10-CM | POA: Diagnosis not present

## 2020-09-11 DIAGNOSIS — M940 Chondrocostal junction syndrome [Tietze]: Secondary | ICD-10-CM

## 2020-09-11 MED ORDER — GUAIFENESIN-CODEINE 100-10 MG/5ML PO SYRP: 5.0000 mL | ORAL_SOLUTION | Freq: Three times a day (TID) | ORAL | 0 refills | Status: DC | PRN

## 2020-09-11 MED ORDER — CYCLOBENZAPRINE HCL 10 MG PO TABS: 10.0000 mg | ORAL_TABLET | Freq: Two times a day (BID) | ORAL | 0 refills | Status: DC | PRN

## 2020-09-11 NOTE — Discharge Instructions (Addendum)
I have sent in flexeril for you to take twice a day as needed for muscle spasms. This medication can make you sleepy. Do not drive or operate heavy machinery with this medication.  I have sent in cough syrup for you to take. This medication can make you sleepy. Do not drive while taking this medication.  Follow up with this office or with primary care if symptoms are persisting.  Follow up in the ER for high fever, trouble swallowing, trouble breathing, other concerning symptoms.

## 2020-09-11 NOTE — ED Triage Notes (Signed)
Pt returns with continued cough for past 4 weeks, has developed pain in left chest with coughing

## 2020-09-12 NOTE — ED Provider Notes (Signed)
[place]   [id number] 09/11/20 Arrival Time: 1941   CC: COVID symptoms  SUBJECTIVE: History from: patient.  ERMAGENE SAIDI is a 50 y.o. female who presents with cough, right rib pain with coughing. Reports positive home covid test about a week ago. Was seen in this office 2 weeks ago and was negative for Covid and flu. Reports Covid exposure. Denies recent travel. Has negative history of Covid. Has not completed Covid vaccines. Has not taken OTC medications for this. Symptoms area worse with activity. Denies previous symptoms in the past. Denies fever, chills, fatigue, sinus pain, rhinorrhea, sore throat, SOB, wheezing, nausea, changes in bowel or bladder habits.    ROS: As per HPI.  All other pertinent ROS negative.     Past Medical History:  Diagnosis Date   GERD (gastroesophageal reflux disease)    Hashimoto's disease    Hashimoto's disease    Hashimoto's disease    Migraine    Past Surgical History:  Procedure Laterality Date   COLONOSCOPY  12/2019   [facility]: minimal internal hemorrhoids, no polyps. Moderate external hemorrhoid skin tags.    melanoma back excision     melanoma thigh excision  2009   No Known Allergies No current facility-administered medications on file prior to encounter.   Current Outpatient Medications on File Prior to Encounter  Medication Sig Dispense Refill   benzonatate (TESSALON) 100 MG capsule Take 1 capsule (100 mg total) by mouth every 8 (eight) hours. 21 capsule 0   Cetirizine HCl (ZYRTEC ALLERGY) 10 MG CAPS Take by mouth as needed.     Docusate Sodium (DSS) 100 MG CAPS Take 3 capsules by mouth in the morning. (Patient not taking: Reported on 06/13/2020)     DULoxetine (CYMBALTA) 60 MG capsule Take 1 capsule by mouth 2 (two) times daily.     Ferrous Fumarate 150 MG TABS Take 1 tablet by mouth every other day.     Levothyroxine Sodium (TIROSINT-SOL) 100 MCG/ML SOLN 100MCG 1 ML daily     lubiprostone (AMITIZA) 24 MCG capsule  Take 1 capsule (24 mcg total) by mouth 2 (two) times daily with a meal. 60 capsule 3   omeprazole (PRILOSEC) 20 MG capsule Take 20 mg by mouth 2 (two) times daily.     rizatriptan (MAXALT) 10 MG tablet Take 10 mg by mouth as needed for migraine. May repeat in 2 hours if needed     Social History   Socioeconomic History   Marital status: Married    Spouse name: Not on file   Number of children: Not on file   Years of education: Not on file   Highest education level: Not on file  Occupational History   Not on file  Tobacco Use   Smoking status: Former    Pack years: 0.00    Types: Cigarettes   Smokeless tobacco: Never   Tobacco comments:    smoked as a teenager  Substance and Sexual Activity   Alcohol use: Yes    Comment: once a month   Drug use: Never   Sexual activity: Not on file  Other Topics Concern   Not on file  Social History Narrative   Not on file   Social Determinants of Health   Financial Resource Strain: Not on file  Food Insecurity: Not on file  Transportation Needs: Not on file  Physical Activity: Not on file  Stress: Not on file  Social Connections: Not on file  Intimate Partner Violence: Not on file  Family History  Problem Relation Age of Onset   Breast cancer Mother 49   Colon cancer Neg Hx    Colon polyps Neg Hx     OBJECTIVE:  Vitals:   09/11/20 1802  BP: (!) 132/92  Pulse: 96  Resp: 20  Temp: 98.6 F (37 C)  SpO2: 98%     General appearance: alert; appears fatigued, but nontoxic; speaking in full sentences and tolerating own secretions HEENT: NCAT; Ears: EACs clear, TMs pearly gray; Eyes: PERRL.  EOM grossly intact. Sinuses: nontender; Nose: nares patent with clear rhinorrhea, Throat: oropharynx erythematous, cobblestoning present, tonsils non erythematous or enlarged, uvula midline  Neck: supple without LAD Lungs: unlabored respirations, symmetrical air entry; cough: moderate; no respiratory distress; CTAB, right chest and rib  tenderness Heart: regular rate and rhythm.  Radial pulses 2+ symmetrical bilaterally Skin: warm and dry Psychological: alert and cooperative; normal mood and affect  LABS:  No results found for this or any previous visit (from the past 24 hour(s)).   ASSESSMENT & PLAN:  1. Costochondritis   2. Cough     Meds ordered this encounter  Medications   guaiFENesin-codeine (ROBITUSSIN AC) 100-10 MG/5ML syrup    Sig: Take 5 mLs by mouth 3 (three) times daily as needed for cough.    Dispense:  240 mL    Refill:  0    Order Specific Question:   Supervising Provider    Answer:   Chase Picket [[id number]]   cyclobenzaprine (FLEXERIL) 10 MG tablet    Sig: Take 1 tablet (10 mg total) by mouth 2 (two) times daily as needed for muscle spasms.    Dispense:  20 tablet    Refill:  0    Order Specific Question:   Supervising Provider    Answer:   Chase Picket [[id number]]    Continue supportive care at home Xray negative for rib fracture or other abnormality today Prescribed cheratussin cough syrup Prescribed flexeril BID prn muscle pain and spasm Sedation precautions given Do NOT take these medications together Get plenty of rest and push fluids Use OTC zyrtec for nasal congestion, runny nose, and/or sore throat Use OTC flonase for nasal congestion and runny nose Use medications daily for symptom relief Use OTC medications like ibuprofen or tylenol as needed fever or pain Call or go to the ED if you have any new or worsening symptoms such as fever, worsening cough, shortness of breath, chest tightness, chest pain, turning blue, changes in mental status.  Reviewed expectations re: course of current medical issues. Questions answered. Outlined signs and symptoms indicating need for more acute intervention. Patient verbalized understanding. After Visit Summary given.          Faustino Congress, NP 09/12/20 Bosie Helper

## 2020-12-24 ENCOUNTER — Other Ambulatory Visit: Payer: Self-pay | Admitting: Family Medicine

## 2020-12-24 DIAGNOSIS — Z1231 Encounter for screening mammogram for malignant neoplasm of breast: Secondary | ICD-10-CM

## 2021-01-03 ENCOUNTER — Ambulatory Visit: Payer: 59

## 2021-01-15 ENCOUNTER — Other Ambulatory Visit: Payer: Self-pay | Admitting: Gastroenterology

## 2021-03-06 ENCOUNTER — Ambulatory Visit
Admission: RE | Admit: 2021-03-06 | Discharge: 2021-03-06 | Disposition: A | Discharge status: Home / Self Care | Payer: 59 | Source: Ambulatory Visit | Attending: Family Medicine | Admitting: Family Medicine

## 2021-03-06 DIAGNOSIS — Z1231 Encounter for screening mammogram for malignant neoplasm of breast: Secondary | ICD-10-CM

## 2021-03-07 ENCOUNTER — Ambulatory Visit: Payer: 59

## 2021-03-17 NOTE — Progress Notes (Signed)
   [place] Banding Note:   Jackie Ward is a 50 y.o. female presenting today for consideration of hemorrhoid banding. History of constipation, symptomatic Grade 3 hemorrhoids, and recent colonoscopy from Sept 2021 on file at [facility] with internal hemorrhoids and no polyps. Previously have banded left lateral. Some improvement with first banding, then recurrence of symptoms. Initial banding approximately 9 months ago.    The patient presents with symptomatic grade 3  hemorrhoids, unresponsive to maximal medical therapy, requesting rubber band ligation of her hemorrhoidal disease. All risks, benefits, and alternative forms of therapy were described and informed consent was obtained.  The decision was made to band the left lateral internal hemorrhoid again, as this was notably prominent just with DRE, and the [place] was used to perform band ligation without complication. Digital anorectal examination was then performed to assure proper positioning of the band, and to adjust the banded tissue as required. The patient was discharged home without pain or other issues. Dietary and behavioral recommendations were given and (if necessary prescriptions were given), along with follow-up instructions. The patient will return in followup and possible additional banding as required.  No complications were encountered and the patient tolerated the procedure well.   Annitta Needs, PhD, ANP-BC [facility]

## 2021-03-18 ENCOUNTER — Encounter: Payer: Self-pay | Admitting: Gastroenterology

## 2021-03-18 ENCOUNTER — Other Ambulatory Visit: Payer: Self-pay

## 2021-03-18 ENCOUNTER — Ambulatory Visit (INDEPENDENT_AMBULATORY_CARE_PROVIDER_SITE_OTHER): Payer: 59 | Admitting: Gastroenterology

## 2021-03-18 VITALS — BP 126/81 | HR 109 | Temp 96.9°F | Ht 65.0 in | Wt 209.2 lb

## 2021-03-18 DIAGNOSIS — K642 Third degree hemorrhoids: Secondary | ICD-10-CM | POA: Diagnosis not present

## 2021-03-18 NOTE — Patient Instructions (Signed)
Let's increase Amitiza to twice a day, taking with food to avoid nausea.   I will see you back in January!  Have a wonderful Christmas!  I enjoyed seeing you again today! As you know, I value our relationship and want to provide genuine, compassionate, and quality care. I welcome your feedback. If you receive a survey regarding your visit,  I greatly appreciate you taking time to fill this out. See you next time!  Annitta Needs, PhD, ANP-BC [facility]

## 2021-04-09 NOTE — Progress Notes (Signed)
° °  Right anterior. Was unable to pull up right posterior tissue.    [place] Banding Note:   Jackie Ward is a 51 y.o. female presenting today for consideration of hemorrhoid banding.  History of constipation, symptomatic Grade 3 hemorrhoids, and recent colonoscopy from Sept 2021 on file at [facility] with internal hemorrhoids and no polyps. Previously have banded left lateral X 2. She has noted improvement overall. Still with prolapsing tissue at times. No bleeding.    The patient presents with symptomatic grade 3 hemorrhoids, unresponsive to maximal medical therapy, requesting rubber band ligation of her hemorrhoidal disease. All risks, benefits, and alternative forms of therapy were described and informed consent was obtained.  The decision was made to band the right anterior internal hemorrhoid, and the [place] was used to perform band ligation without complication. The band did not capture sufficient tissue. I turned attention to right posterior but insufficient tissue present. I then banded neutrally, with great results. Appears to be in right anterior position. Digital anorectal examination was then performed to assure proper positioning of the band, and to adjust the banded tissue as required. The patient was discharged home without pain or other issues. Dietary and behavioral recommendations were given and (if necessary prescriptions were given), along with follow-up instructions. The patient may return as needed. Latex-free bands were used.   No complications were encountered and the patient tolerated the procedure well.   Annitta Needs, PhD, ANP-BC [facility]

## 2021-04-10 ENCOUNTER — Other Ambulatory Visit: Payer: Self-pay

## 2021-04-10 ENCOUNTER — Ambulatory Visit (INDEPENDENT_AMBULATORY_CARE_PROVIDER_SITE_OTHER): Payer: 59 | Admitting: Gastroenterology

## 2021-04-10 ENCOUNTER — Encounter: Payer: Self-pay | Admitting: Gastroenterology

## 2021-04-10 VITALS — BP 131/81 | HR 99 | Temp 97.0°F | Ht 65.0 in | Wt 202.6 lb

## 2021-04-10 DIAGNOSIS — K642 Third degree hemorrhoids: Secondary | ICD-10-CM | POA: Diagnosis not present

## 2021-04-10 NOTE — Patient Instructions (Signed)
Please call if you feel you need further banding!  Otherwise, we will see you as needed!  I enjoyed seeing you again today! As you know, I value our relationship and want to provide genuine, compassionate, and quality care. I welcome your feedback. If you receive a survey regarding your visit,  I greatly appreciate you taking time to fill this out. See you next time!  Annitta Needs, PhD, ANP-BC [facility]

## 2021-08-18 ENCOUNTER — Telehealth: Payer: Self-pay

## 2021-08-18 NOTE — Telephone Encounter (Signed)
PA for Amitiza done. Tried/failed Linzess 72 and 145 MCG. Dx: chronic constipation. Waiting for response from Cover My Meds. ?

## 2021-08-18 NOTE — Telephone Encounter (Signed)
PA  for Amitiza 24 mcg was approved 08/18/2021 through 08/19/2022. Phoned and notified the pt. That her Rx has been approved. Will give to Manuela Schwartz for scan ?

## 2021-11-08 ENCOUNTER — Other Ambulatory Visit: Payer: Self-pay | Admitting: Gastroenterology

## 2022-06-29 ENCOUNTER — Other Ambulatory Visit: Payer: Self-pay | Admitting: Physician Assistant

## 2022-06-29 DIAGNOSIS — Z1231 Encounter for screening mammogram for malignant neoplasm of breast: Secondary | ICD-10-CM

## 2022-08-13 ENCOUNTER — Ambulatory Visit: Payer: 59

## 2022-09-04 ENCOUNTER — Ambulatory Visit
Admission: RE | Admit: 2022-09-04 | Discharge: 2022-09-04 | Disposition: A | Discharge status: Home / Self Care | Payer: 59 | Source: Ambulatory Visit | Attending: Physician Assistant | Admitting: Physician Assistant

## 2022-09-04 DIAGNOSIS — Z1231 Encounter for screening mammogram for malignant neoplasm of breast: Secondary | ICD-10-CM

## 2022-11-24 ENCOUNTER — Other Ambulatory Visit: Payer: Self-pay | Admitting: Gastroenterology

## 2022-12-02 ENCOUNTER — Other Ambulatory Visit: Payer: Self-pay

## 2022-12-02 ENCOUNTER — Encounter: Payer: Self-pay | Admitting: Emergency Medicine

## 2022-12-02 ENCOUNTER — Ambulatory Visit
Admission: EM | Admit: 2022-12-02 | Discharge: 2022-12-02 | Disposition: A | Discharge status: Home / Self Care | Payer: 59 | Attending: Family Medicine | Admitting: Family Medicine

## 2022-12-02 DIAGNOSIS — B9789 Other viral agents as the cause of diseases classified elsewhere: Secondary | ICD-10-CM | POA: Insufficient documentation

## 2022-12-02 DIAGNOSIS — Z20822 Contact with and (suspected) exposure to covid-19: Secondary | ICD-10-CM

## 2022-12-02 DIAGNOSIS — J069 Acute upper respiratory infection, unspecified: Secondary | ICD-10-CM | POA: Diagnosis not present

## 2022-12-02 NOTE — ED Provider Notes (Signed)
[facility]    CSN: [id number] Arrival date & time: 12/02/22  1334      History   Chief Complaint Chief Complaint  Patient presents with   Covid Exposure    HPI Jackie Ward is a 52 y.o. female.   Patient presenting today with 1 day history of sore throat, runny nose, headache.  Denies cough, chest pain, shortness of breath, abdominal pain, nausea vomiting or diarrhea.  Recent exposures to COVID.  So far tried Excedrin Migraine with minimal relief.    Past Medical History:  Diagnosis Date   GERD (gastroesophageal reflux disease)    Hashimoto's disease    Hashimoto's disease    Hashimoto's disease    Migraine     Patient Active Problem List   Diagnosis Date Noted   Prolapsed internal hemorrhoids, grade 3 05/01/2020   GERD (gastroesophageal reflux disease) 05/01/2020    Past Surgical History:  Procedure Laterality Date   COLONOSCOPY  12/2019   [facility]: minimal internal hemorrhoids, no polyps. Moderate external hemorrhoid skin tags.    melanoma back excision     melanoma thigh excision  2009    OB History   No obstetric history on file.      Home Medications    Prior to Admission medications   Medication Sig Start Date End Date Taking? Authorizing Provider  OnabotulinumtoxinA (BOTOX IM) Inject into the muscle every 3 (three) months.   Yes [provider]  Cetirizine HCl (ZYRTEC ALLERGY) 10 MG CAPS Take by mouth as needed.    [provider]  cyclobenzaprine (FLEXERIL) 5 MG tablet Take 5 mg by mouth daily.    [provider]  DULoxetine (CYMBALTA) 60 MG capsule Take 60 mg by mouth daily. 03/01/20   [provider]  Levothyroxine Sodium (TIROSINT-SOL) 100 MCG/ML SOLN 125 mcg. 1 ML daily 05/03/19   [provider]  lubiprostone (AMITIZA) 24 MCG capsule TAKE 1 CAPSULE BY MOUTH TWICE DAILY WITH MEALS 11/24/22   Gelene Mink, NP  omeprazole (PRILOSEC) 20 MG capsule Take 20 mg by mouth daily.     [provider]  rizatriptan (MAXALT) 10 MG tablet Take 10 mg by mouth as needed for migraine. May repeat in 2 hours if needed    [provider]    Family History Family History  Problem Relation Age of Onset   Breast cancer Mother 22   Colon cancer Neg Hx    Colon polyps Neg Hx     Social History Social History   Tobacco Use   Smoking status: Former    Types: Cigarettes   Smokeless tobacco: Never   Tobacco comments:    smoked as a teenager  Substance Use Topics   Alcohol use: Yes    Comment: once a month   Drug use: Never     Allergies   Patient has no known allergies.   Review of Systems Review of Systems Per HPI  Physical Exam Triage Vital Signs ED Triage Vitals  Encounter Vitals Group     BP 12/02/22 1357 119/81     Systolic BP Percentile --      Diastolic BP Percentile --      Pulse Rate 12/02/22 1357 85     Resp 12/02/22 1357 20     Temp 12/02/22 1357 98 F (36.7 C)     Temp Source 12/02/22 1357 Oral     SpO2 12/02/22 1357 98 %     Weight --  Height --      Head Circumference --      Peak Flow --      Pain Score 12/02/22 1353 0     Pain Loc --      Pain Education --      Exclude from Growth Chart --    No data found.  Updated Vital Signs BP 119/81 (BP Location: Right Arm)   Pulse 85   Temp 98 F (36.7 C) (Oral)   Resp 20   LMP 09/13/2022   SpO2 98%   Visual Acuity Right Eye Distance:   Left Eye Distance:   Bilateral Distance:    Right Eye Near:   Left Eye Near:    Bilateral Near:     Physical Exam Vitals and nursing note reviewed.  Constitutional:      Appearance: Normal appearance.  HENT:     Head: Atraumatic.     Right Ear: Tympanic membrane and external ear normal.     Left Ear: Tympanic membrane and external ear normal.     Nose: Rhinorrhea present. No congestion.     Mouth/Throat:     Mouth: Mucous membranes are moist.     Pharynx: Posterior oropharyngeal erythema present.  Eyes:      Extraocular Movements: Extraocular movements intact.     Conjunctiva/sclera: Conjunctivae normal.  Cardiovascular:     Rate and Rhythm: Normal rate and regular rhythm.     Heart sounds: Normal heart sounds.  Pulmonary:     Effort: Pulmonary effort is normal.     Breath sounds: Normal breath sounds. No wheezing.  Musculoskeletal:        General: Normal range of motion.     Cervical back: Normal range of motion and neck supple.  Skin:    General: Skin is warm and dry.  Neurological:     Mental Status: She is alert and oriented to person, place, and time.  Psychiatric:        Mood and Affect: Mood normal.        Thought Content: Thought content normal.      UC Treatments / Results  Labs (all labs ordered are listed, but only abnormal results are displayed) Labs Reviewed  SARS CORONAVIRUS 2 (TAT 6-24 HRS)    EKG   Radiology No results found.  Procedures Procedures (including critical care time)  Medications Ordered in UC Medications - No data to display  Initial Impression / Assessment and Plan / UC Course  I have reviewed the triage vital signs and the nursing notes.  Pertinent labs & imaging results that were available during my care of the patient were reviewed by me and considered in my medical decision making (see chart for details).     Vital signs and exam reassuring today and suspicious for viral respiratory infection.  COVID testing pending, good candidate for Paxlovid if positive.  Discussed supportive over-the-counter medications, home care additionally.  Return for worsening symptoms.  Final Clinical Impressions(s) / UC Diagnoses   Final diagnoses:  Exposure to COVID-19 virus  Viral URI   Discharge Instructions   None    ED Prescriptions   None    PDMP not reviewed this encounter.   Particia Nearing, [place] 12/02/22 1414

## 2022-12-02 NOTE — ED Triage Notes (Signed)
Pt reports covid exposure x2 days ago. Pt reports sore throat, headache, fatigue.

## 2022-12-03 LAB — SARS CORONAVIRUS 2 (TAT 6-24 HRS): SARS Coronavirus 2: NEGATIVE

## 2023-04-13 IMAGING — MG MM DIGITAL SCREENING BILAT W/ TOMO AND CAD
8 series · 8 of 24 positions shown · non-contrast
Comparison: Previous exam(s).

CLINICAL DATA: Screening.

EXAM:
DIGITAL SCREENING BILATERAL MAMMOGRAM WITH TOMOSYNTHESIS AND CAD
TECHNIQUE: Bilateral screening digital craniocaudal and mediolateral oblique
mammograms were obtained. Bilateral screening digital breast
tomosynthesis was performed. The images were evaluated with
computer-aided detection.

[L MLO synth-2D]
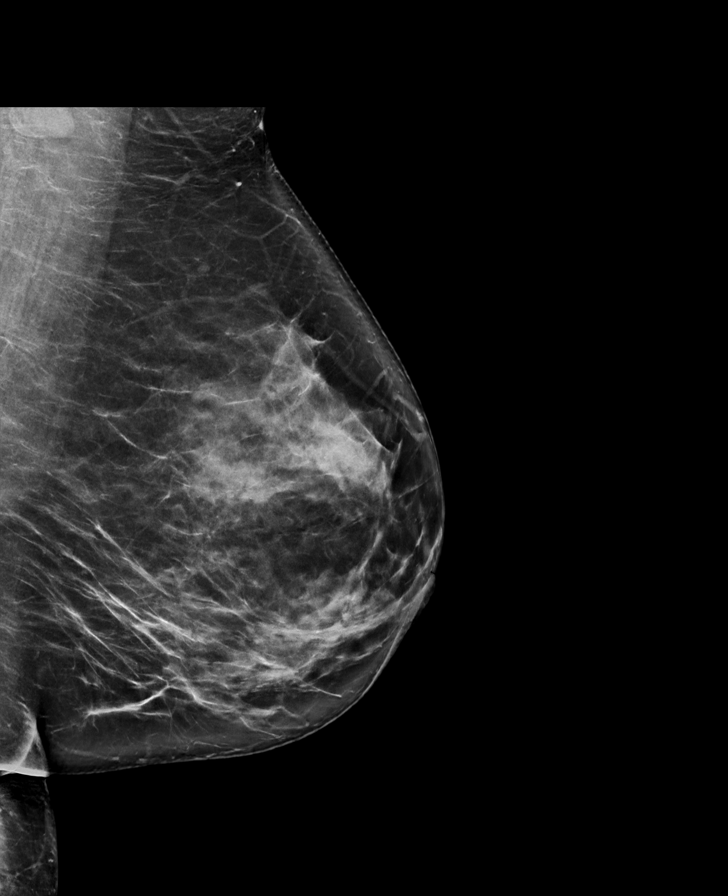

[R CC synth-2D]
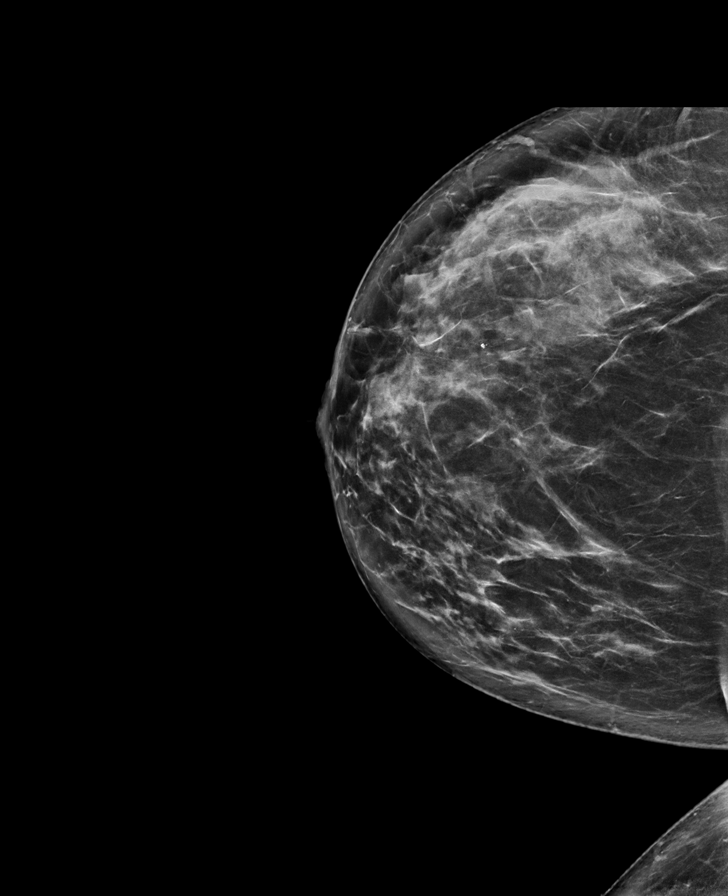

[R MLO synth-2D]
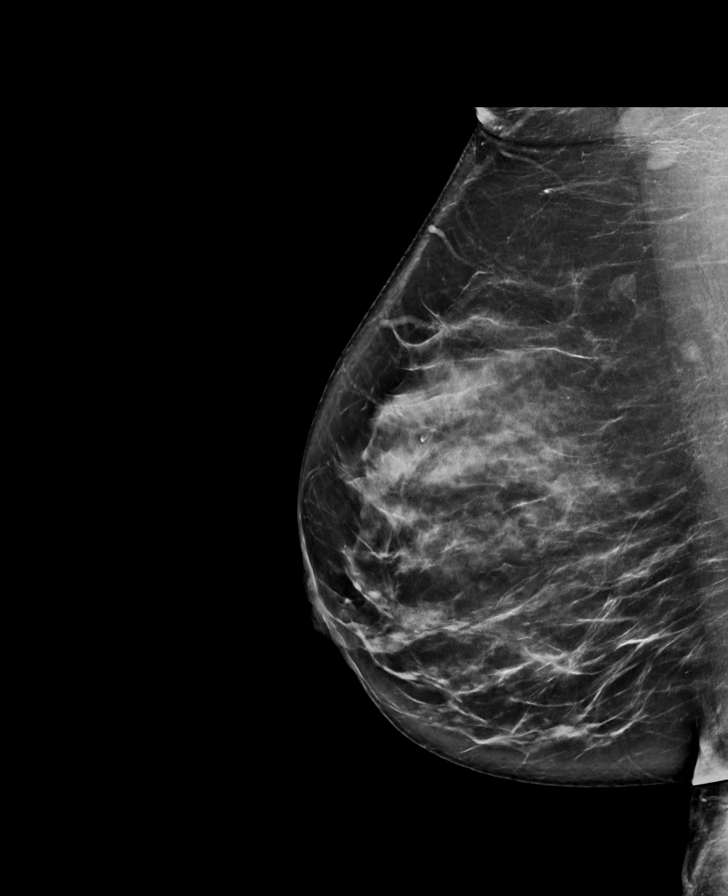

[L CC synth-2D]
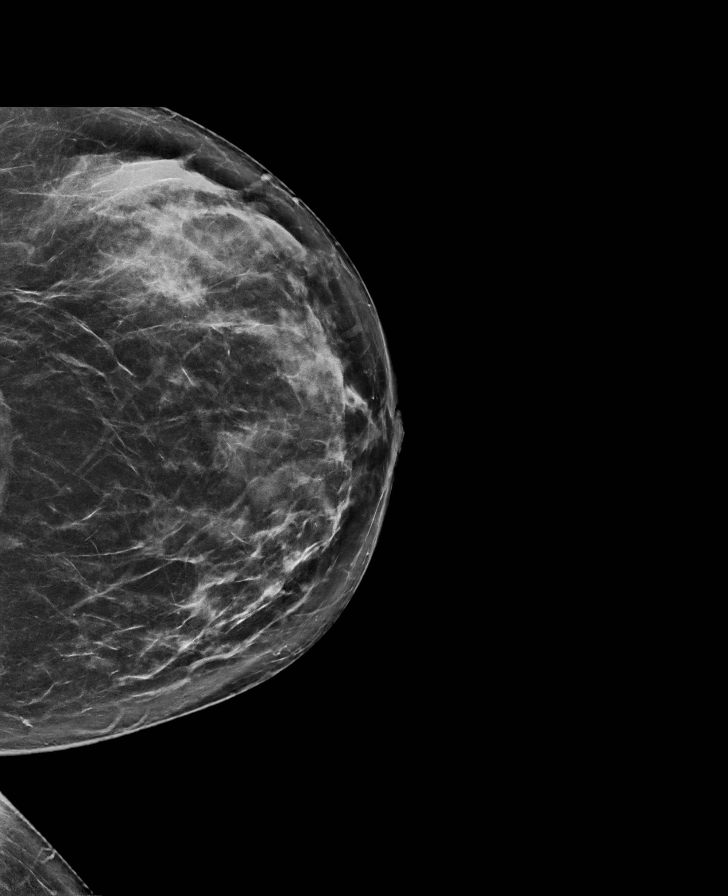

[R CC tomo · tomo slice 43/84.0]
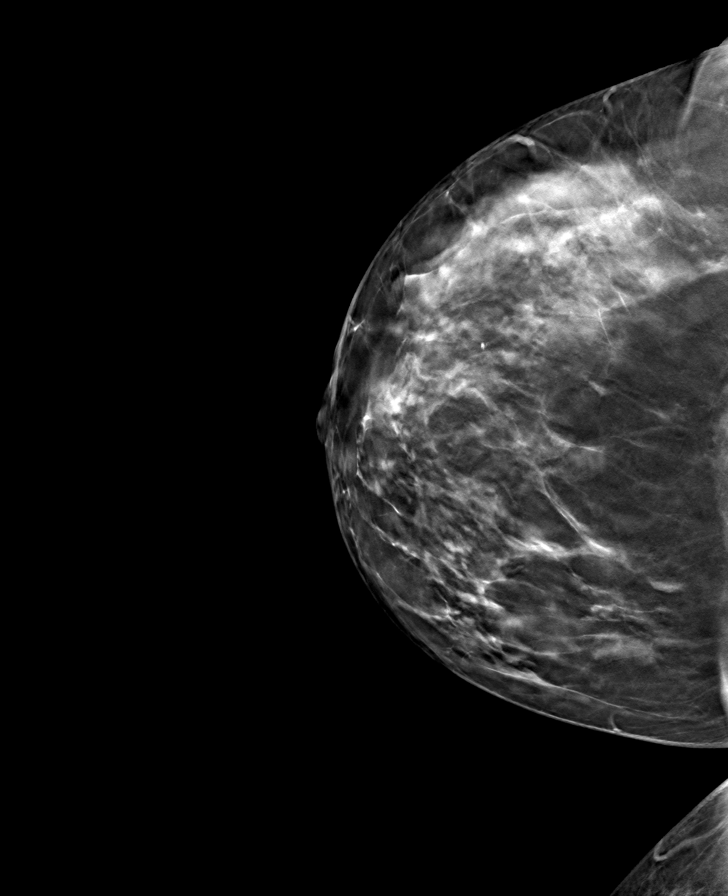

[L CC tomo · tomo slice 41/80.0]
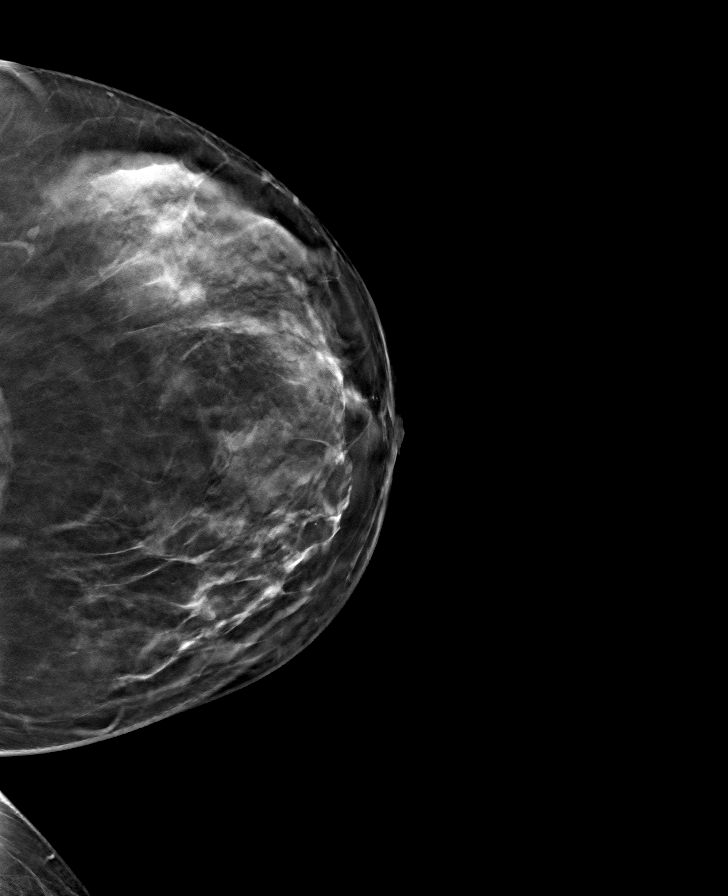

[R MLO tomo · tomo slice 46/91.0]
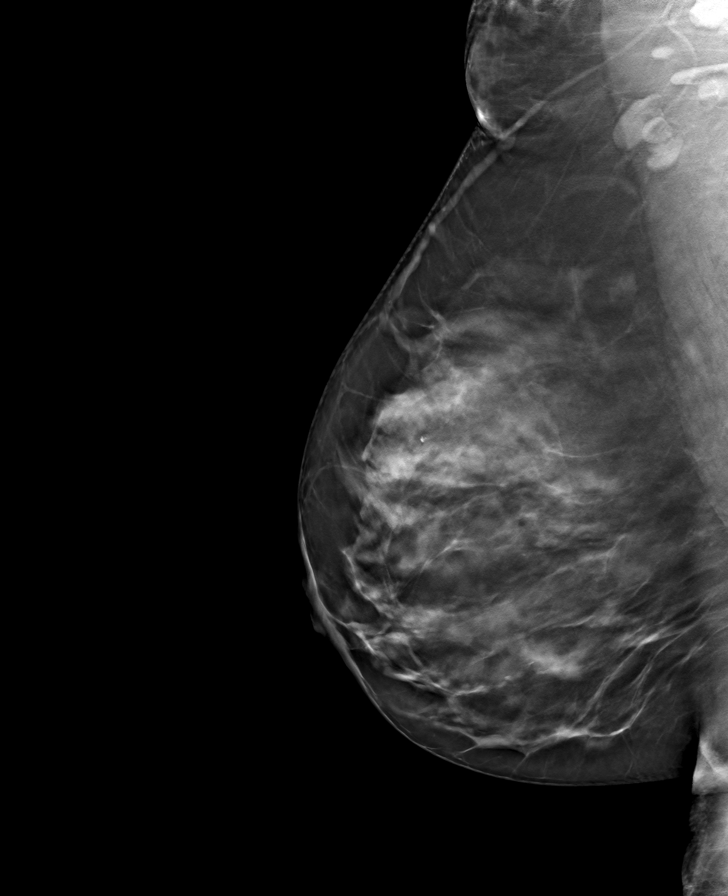

[L MLO tomo · tomo slice 46/91.0]
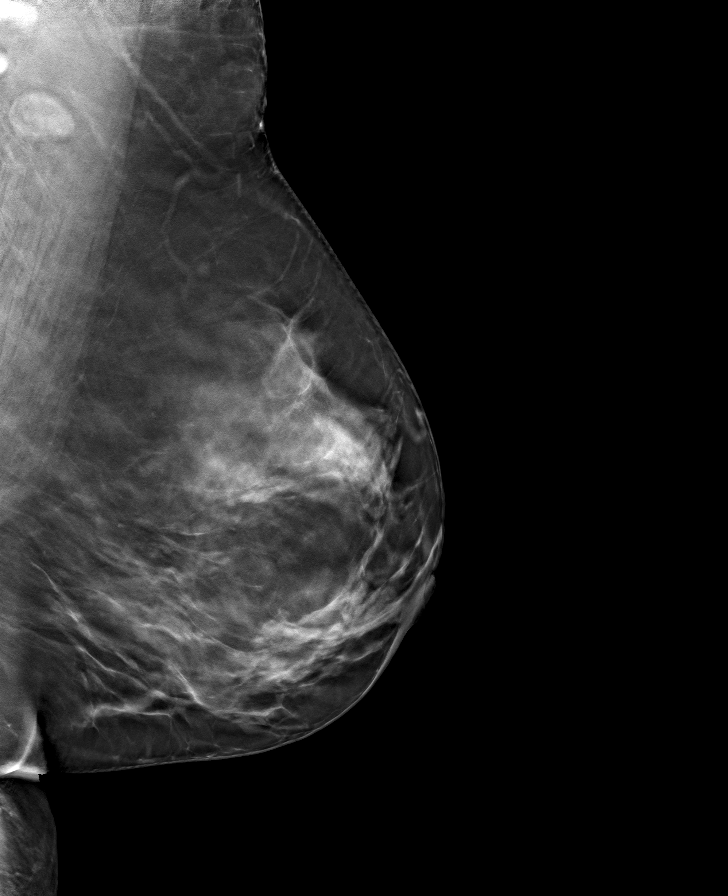

[8 of 24 positions shown; findings below may reference images not displayed]

ACR Breast Density Category c: The breast tissue is heterogeneously
dense, which may obscure small masses.
FINDINGS: There are no findings suspicious for malignancy.
IMPRESSION: No mammographic evidence of malignancy. A result letter of this
screening mammogram will be mailed directly to the patient.

RECOMMENDATION:
Screening mammogram in one year. (Code:Q3-W-BC3)

BI-RADS CATEGORY  1: Negative.

## 2024-02-25 ENCOUNTER — Other Ambulatory Visit: Payer: Self-pay | Admitting: Physician Assistant

## 2024-02-25 DIAGNOSIS — Z1231 Encounter for screening mammogram for malignant neoplasm of breast: Secondary | ICD-10-CM

## 2024-03-31 ENCOUNTER — Ambulatory Visit
